# Patient Record
Sex: Male | Born: 1990
Health system: Southern US, Community
[De-identification: ages and names within clinical notes are randomized; demographics above are authoritative.]

## PROBLEM LIST (undated history)

## (undated) DIAGNOSIS — F909 Attention-deficit hyperactivity disorder, unspecified type: Secondary | ICD-10-CM

---

## 2017-01-16 ENCOUNTER — Encounter (HOSPITAL_COMMUNITY): Payer: Self-pay | Admitting: Emergency Medicine

## 2017-01-16 ENCOUNTER — Emergency Department (HOSPITAL_COMMUNITY): Payer: 59

## 2017-01-16 ENCOUNTER — Emergency Department (HOSPITAL_COMMUNITY)
Admission: EM | Admit: 2017-01-16 | Discharge: 2017-01-16 | Disposition: A | Discharge status: Home / Self Care | Payer: 59 | Attending: Emergency Medicine | Admitting: Emergency Medicine

## 2017-01-16 DIAGNOSIS — Z79899 Other long term (current) drug therapy: Secondary | ICD-10-CM | POA: Insufficient documentation

## 2017-01-16 DIAGNOSIS — R000 Tachycardia, unspecified: Secondary | ICD-10-CM

## 2017-01-16 DIAGNOSIS — R079 Chest pain, unspecified: Secondary | ICD-10-CM | POA: Diagnosis present

## 2017-01-16 DIAGNOSIS — E860 Dehydration: Secondary | ICD-10-CM | POA: Diagnosis not present

## 2017-01-16 DIAGNOSIS — R0789 Other chest pain: Secondary | ICD-10-CM | POA: Diagnosis not present

## 2017-01-16 DIAGNOSIS — F17210 Nicotine dependence, cigarettes, uncomplicated: Secondary | ICD-10-CM | POA: Insufficient documentation

## 2017-01-16 DIAGNOSIS — F909 Attention-deficit hyperactivity disorder, unspecified type: Secondary | ICD-10-CM | POA: Insufficient documentation

## 2017-01-16 DIAGNOSIS — R0602 Shortness of breath: Secondary | ICD-10-CM | POA: Diagnosis not present

## 2017-01-16 HISTORY — DX: Attention-deficit hyperactivity disorder, unspecified type: F90.9

## 2017-01-16 LAB — CBC WITH DIFFERENTIAL/PLATELET
BASOS PCT: 0 %
Basophils Absolute: 0 10*3/uL (ref 0.0–0.1)
EOS ABS: 0.1 10*3/uL (ref 0.0–0.7)
EOS PCT: 1 %
HCT: 45.2 % (ref 39.0–52.0)
HEMOGLOBIN: 15.5 g/dL (ref 13.0–17.0)
LYMPHS ABS: 2.7 10*3/uL (ref 0.7–4.0)
Lymphocytes Relative: 24 %
MCH: 30.3 pg (ref 26.0–34.0)
MCHC: 34.3 g/dL (ref 30.0–36.0)
MCV: 88.3 fL (ref 78.0–100.0)
Monocytes Absolute: 0.7 10*3/uL (ref 0.1–1.0)
Monocytes Relative: 6 %
NEUTROS PCT: 69 %
Neutro Abs: 8 10*3/uL — ABNORMAL HIGH (ref 1.7–7.7)
PLATELETS: 310 10*3/uL (ref 150–400)
RBC: 5.12 MIL/uL (ref 4.22–5.81)
RDW: 12.9 % (ref 11.5–15.5)
WBC: 11.6 10*3/uL — AB (ref 4.0–10.5)

## 2017-01-16 LAB — COMPREHENSIVE METABOLIC PANEL
ALK PHOS: 38 U/L (ref 38–126)
ALT: 25 U/L (ref 17–63)
AST: 53 U/L — AB (ref 15–41)
Albumin: 4.6 g/dL (ref 3.5–5.0)
Anion gap: 14 (ref 5–15)
BILIRUBIN TOTAL: 1 mg/dL (ref 0.3–1.2)
BUN: 9 mg/dL (ref 6–20)
CALCIUM: 9.5 mg/dL (ref 8.9–10.3)
CO2: 21 mmol/L — ABNORMAL LOW (ref 22–32)
CREATININE: 1.26 mg/dL — AB (ref 0.61–1.24)
Chloride: 100 mmol/L — ABNORMAL LOW (ref 101–111)
Glucose, Bld: 124 mg/dL — ABNORMAL HIGH (ref 65–99)
Potassium: 3.4 mmol/L — ABNORMAL LOW (ref 3.5–5.1)
Sodium: 135 mmol/L (ref 135–145)
TOTAL PROTEIN: 7 g/dL (ref 6.5–8.1)

## 2017-01-16 LAB — RAPID URINE DRUG SCREEN, HOSP PERFORMED
Amphetamines: NOT DETECTED
Barbiturates: NOT DETECTED
Benzodiazepines: NOT DETECTED
COCAINE: NOT DETECTED
OPIATES: NOT DETECTED
Tetrahydrocannabinol: NOT DETECTED

## 2017-01-16 LAB — I-STAT CHEM 8, ED
BUN: 12 mg/dL (ref 6–20)
CHLORIDE: 101 mmol/L (ref 101–111)
CREATININE: 1 mg/dL (ref 0.61–1.24)
Calcium, Ion: 0.98 mmol/L — ABNORMAL LOW (ref 1.15–1.40)
GLUCOSE: 129 mg/dL — AB (ref 65–99)
HEMATOCRIT: 46 % (ref 39.0–52.0)
HEMOGLOBIN: 15.6 g/dL (ref 13.0–17.0)
POTASSIUM: 3.3 mmol/L — AB (ref 3.5–5.1)
Sodium: 139 mmol/L (ref 135–145)
TCO2: 21 mmol/L (ref 0–100)

## 2017-01-16 LAB — I-STAT VENOUS BLOOD GAS, ED
Acid-Base Excess: 2 mmol/L (ref 0.0–2.0)
BICARBONATE: 26.9 mmol/L (ref 20.0–28.0)
O2 SAT: 58 %
TCO2: 28 mmol/L (ref 0–100)
pCO2, Ven: 40.4 mmHg — ABNORMAL LOW (ref 44.0–60.0)
pH, Ven: 7.431 — ABNORMAL HIGH (ref 7.250–7.430)
pO2, Ven: 29 mmHg — CL (ref 32.0–45.0)

## 2017-01-16 LAB — I-STAT TROPONIN, ED: TROPONIN I, POC: 0 ng/mL (ref 0.00–0.08)

## 2017-01-16 MED ORDER — IOPAMIDOL (ISOVUE-370) INJECTION 76%
INTRAVENOUS | Status: AC
  Administered 2017-01-16: 100 mL
  Filled 2017-01-16: qty 100

## 2017-01-16 MED ORDER — GI COCKTAIL ~~LOC~~
30.0000 mL | Freq: Once | ORAL | Status: AC
  Administered 2017-01-16: 30 mL via ORAL
  Filled 2017-01-16: qty 30

## 2017-01-16 MED ORDER — SODIUM CHLORIDE 0.9 % IV BOLUS (SEPSIS)
1000.0000 mL | Freq: Once | INTRAVENOUS | Status: AC
  Administered 2017-01-16: 1000 mL via INTRAVENOUS

## 2017-01-16 NOTE — ED Triage Notes (Signed)
Brought by ems from home for c/o llq abdominal pain that started today as well as tachycardia and mid chest pain worse with breathing.  Reports being hungover today and took phentermine this morning.  Noted to be dyspneic and tachy on arrival.

## 2017-01-16 NOTE — Discharge Instructions (Signed)
Your work up today was reassuring. You were found to be mildly dehydrated. You were given fluids though an IV for this.  Your chest CT did not show any concerning cause of your symptoms. You heart enzyme was normal and your EKG did not suggest you to have any stress on your heart.   It is possible that your symptoms may be due to anxiety. Dehydration in the presence of phentermine use is also a possibility. We advise that you follow up with your primary care doctor to have your kidney function rechecked in 1-2 weeks. Be sure you are drinking plenty of water to prevent dehydration. Avoid phentermine until you consult with your primary care doctor. You may return to the ED, as needed, for new or concerning symptoms.

## 2017-01-16 NOTE — ED Notes (Signed)
Back from CT

## 2017-01-16 NOTE — ED Provider Notes (Signed)
[facility]-EMERGENCY DEPT Provider Note   CSN: [id number] Arrival date & time: 01/16/17  2058    History   Chief Complaint Chief Complaint  Patient presents with  . Shortness of Breath  . Chest Pain    HPI Philip Copeland is a 26 y.o. male.  26 year old male with a history of ADHD presents to the emergency department for evaluation of chest pain and shortness of breath. He reports that he noticed that he was breathing heavier this morning. This became worse as the day progressed and associated with chest pain. Patient complaining of left-sided chest pain that is pleuritic, worse with breathing. He also notes palpitations and a "numb" sensation in his left lower quadrant as well as paresthesias in his RLE. He did go out last night and reports feeling hungover this morning. Triage note reports use of phentermine early this AM. No associated fevers, though patient does report coughing over the last few weeks. He attributes his symptoms to possible bronchitis; cough productive of yellow phlegm. Patient denies leg swelling. No personal or Fhx of DVT/PE. Patient with no hx of chronic medical problems. No hx of abdominal surgeries or anxiety.   The history is provided by the patient, a relative and a parent. No language interpreter was used.    Past Medical History:  Diagnosis Date  . ADHD     There are no active problems to display for this patient.   History reviewed. No pertinent surgical history.    Home Medications    Prior to Admission medications   Medication Sig Start Date End Date Taking? Authorizing Provider  phentermine 37.5 MG capsule Take 37.5 mg by mouth once.   Yes [provider]    Family History No family history on file.  Social History Social History  Substance Use Topics  . Smoking status: Current Some Day Smoker    Types: Cigarettes, E-cigarettes  . Smokeless tobacco: Never Used  . Alcohol use 3.0 oz/week    5 Shots of liquor per week    Comment: 3-4 times a week     Allergies   Patient has no known allergies.   Review of Systems Review of Systems Ten systems reviewed and are negative for acute change, except as noted in the HPI.    Physical Exam Updated Vital Signs BP 132/78   Pulse 85   Temp 98.9 F (37.2 C) (Oral)   Resp 19   Ht 5\' 4"  (1.626 m)   Wt 52.2 kg (115 lb)   SpO2 96%   BMI 19.74 kg/m   Physical Exam  Constitutional: He is oriented to person, place, and time. He appears well-developed and well-nourished. No distress.  Mildly diaphoretic. Anxious appearing. Nontoxic.  HENT:  Head: Normocephalic and atraumatic.  Eyes: Conjunctivae and EOM are normal. No scleral icterus.  Neck: Normal range of motion.  Cardiovascular: Regular rhythm and intact distal pulses.   Tachycardia  Pulmonary/Chest: Effort normal. No respiratory distress. He has no wheezes. He has no rales.  Lungs CTAB. Dyspnea with mild tachypnea. Chest expansion symmetric.  Abdominal: Soft. He exhibits no distension and no mass. There is no guarding.  Soft, nondistended abdomen. No peritoneal signs.  Musculoskeletal: Normal range of motion.  No BLE pitting edema.  Neurological: He is alert and oriented to person, place, and time.  Skin: Skin is warm and dry. No rash noted. He is not diaphoretic. No erythema. No pallor.  Psychiatric: He has a normal mood and affect. His behavior is normal.  Nursing  note and vitals reviewed.    ED Treatments / Results  Labs (all labs ordered are listed, but only abnormal results are displayed) Labs Reviewed  COMPREHENSIVE METABOLIC PANEL - Abnormal; Notable for the following:       Result Value   Potassium 3.4 (*)    Chloride 100 (*)    CO2 21 (*)    Glucose, Bld 124 (*)    Creatinine, Ser 1.26 (*)    AST 53 (*)    All other components within normal limits  CBC WITH DIFFERENTIAL/PLATELET - Abnormal; Notable for the following:    WBC 11.6 (*)    Neutro Abs 8.0 (*)    All other components  within normal limits  I-STAT CHEM 8, ED - Abnormal; Notable for the following:    Potassium 3.3 (*)    Glucose, Bld 129 (*)    Calcium, Ion 0.98 (*)    All other components within normal limits  RAPID URINE DRUG SCREEN, HOSP PERFORMED  BLOOD GAS, VENOUS  I-STAT TROPOININ, ED    EKG  EKG Interpretation  Date/Time:  Saturday January 16 2017 21:04:25 EDT Ventricular Rate:  125 PR Interval:    QRS Duration: 94 QT Interval:  317 QTC Calculation: 458 R Axis:   101 Text Interpretation:  Sinus tachycardia Probable left atrial enlargement Borderline right axis deviation Confirmed by ZAMMIT  MD, JOSEPH [phone]([id number]) on 01/16/2017 9:57:46 PM       Radiology Ct Angio Chest Pe W Or Wo Contrast  Result Date: 01/16/2017 CLINICAL DATA:  Shortness of breath for 2 days and cough for 2 weeks. EXAM: CT ANGIOGRAPHY CHEST WITH CONTRAST TECHNIQUE: Multidetector CT imaging of the chest was performed using the standard protocol during bolus administration of intravenous contrast. Multiplanar CT image reconstructions and MIPs were obtained to evaluate the vascular anatomy. CONTRAST:  80 cc Isovue 370 intravenously. COMPARISON:  Chest radiograph 01/16/2017 FINDINGS: Cardiovascular: Satisfactory opacification of the pulmonary arteries to the segmental level. No evidence of pulmonary embolism. Normal heart size. No pericardial effusion. Mediastinum/Nodes: No enlarged mediastinal, hilar, or axillary lymph nodes. Thyroid gland, trachea, and esophagus demonstrate no significant findings. Lungs/Pleura: Lungs are clear. No pleural effusion or pneumothorax. Upper Abdomen: No acute abnormality. Musculoskeletal: No chest wall abnormality. No acute or significant osseous findings. Review of the MIP images confirms the above findings. IMPRESSION: No evidence of pulmonary embolus. Normal appearance of the thorax. Electronically Signed   By: Ted Mcalpineobrinka  Dimitrova M.D.   On: 01/16/2017 22:43   Dg Chest Port 1 View  Result Date:  01/16/2017 CLINICAL DATA:  Mid chest pain. EXAM: PORTABLE CHEST 1 VIEW COMPARISON:  None. FINDINGS: Cardiomediastinal silhouette is normal. Mediastinal contours appear intact. There is no evidence of focal airspace consolidation, pleural effusion or pneumothorax. Osseous structures are without acute abnormality. Soft tissues are grossly normal. IMPRESSION: No active disease. Electronically Signed   By: Ted Mcalpineobrinka  Dimitrova M.D.   On: 01/16/2017 21:32    Procedures Procedures (including critical care time)  Medications Ordered in ED Medications  sodium chloride 0.9 % bolus 1,000 mL (0 mLs Intravenous Stopped 01/16/17 2220)  iopamidol (ISOVUE-370) 76 % injection (100 mLs  Contrast Given 01/16/17 2154)     Initial Impression / Assessment and Plan / ED Course  I have reviewed the triage vital signs and the nursing notes.  Pertinent labs & imaging results that were available during my care of the patient were reviewed by me and considered in my medical decision making (see chart for details).  26 year old otherwise healthy male presents to the emergency department for evaluation of palpitations, chest pain, and shortness of breath. Symptoms have been progressive over the course of the day. No prior history of anxiety. Patient noted to be mildly diaphoretic, tachycardic, and tachypneic on arrival. No overt abnormalities on physical exam.  Workup initiated with labs and chest CT to rule out pulmonary embolus. Workup reveals mild dehydration with elevated creatinine. Patient hydrated with IV fluids. EKG with borderline right axis deviation. No signs of acute ischemia. Troponin negative. Low suspicion for myocarditis. CT scan today reveals no evidence of pulmonary embolus. No effusion or vascular congestion. No pneumothorax or infiltrate. Heart size appears normal. No evidence of heart strain.  Tachycardia has improved with IV fluids. On repeat assessment, patient states that he is feeling much  better. Question whether symptoms may be partially aggravated by anxiety versus reflux. It is also possible that palpitations and shortness of breath were perpetuated by phentermine use in the setting of dehydration.  Results have been reviewed with the patient in their entirety. He has been instructed to avoid phentermine use and to follow-up with his primary care doctor. Referral to cardiology given should palpitations persist as he may benefit from the use of Holter monitoring. Return precautions discussed and provided. Patient discharged in stable condition with no unaddressed concerns.   Vitals:   01/16/17 2115 01/16/17 2130 01/16/17 2230 01/16/17 2245  BP: 111/85 115/82 132/78 120/82  Pulse: (!) 112 86 85 74  Resp: (!) 32 (!) 39 19 (!) 22  Temp:      TempSrc:      SpO2: 100% 100% 96% 97%  Weight:      Height:        Final Clinical Impressions(s) / ED Diagnoses   Final diagnoses:  Atypical chest pain  Shortness of breath  Tachycardia  Dehydration    New Prescriptions New Prescriptions   No medications on file     Darylene Price 01/16/17 2330    Bethann Berkshire, MD 01/16/17 2348

## 2017-01-16 NOTE — ED Notes (Signed)
Going to CT at this time.

## 2018-03-02 IMAGING — CT CT ANGIO CHEST
2 of 8 series · 19 of 46 positions shown · IV contrast (OMNI)
Comparison: Chest radiograph 01/16/2017

CLINICAL DATA: Shortness of breath for 2 days and cough for 2
weeks.

EXAM:
CT ANGIOGRAPHY CHEST WITH CONTRAST
TECHNIQUE: Multidetector CT imaging of the chest was performed using the
standard protocol during bolus administration of intravenous
contrast. Multiplanar CT image reconstructions and MIPs were
obtained to evaluate the vascular anatomy.
CONTRAST:  80 cc Isovue 370 intravenously.

[Series 6: thins · axial · 0.59mm/px · z∈[+1030,+1283]mm · 16 of 279 slices shown]
[im 13/279  lung]
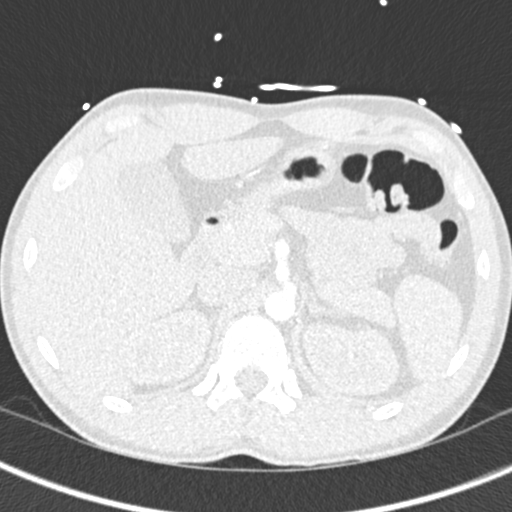
[im 26/279  soft-tissue]
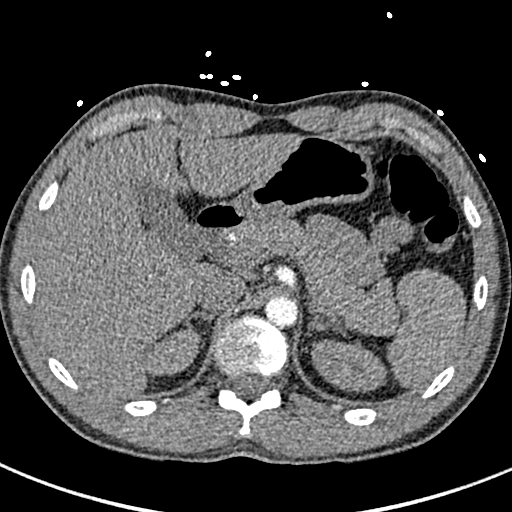
[im 51/279  lung]
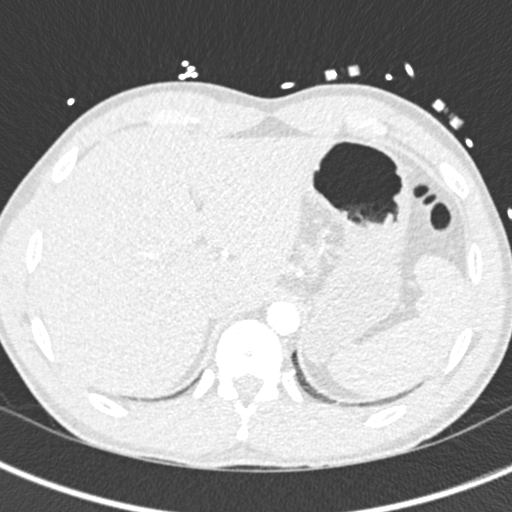
[im 64/279  soft-tissue]
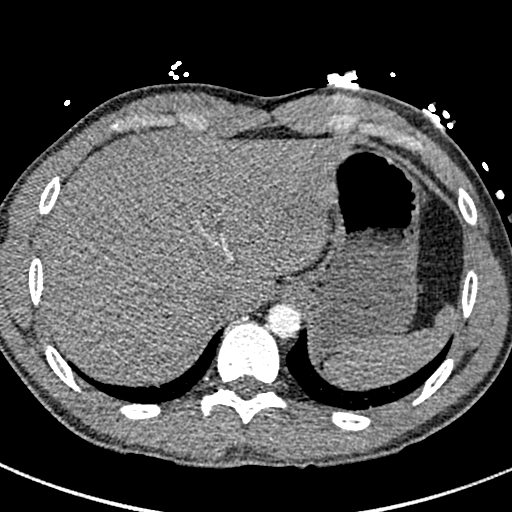
[im 76/279  lung]
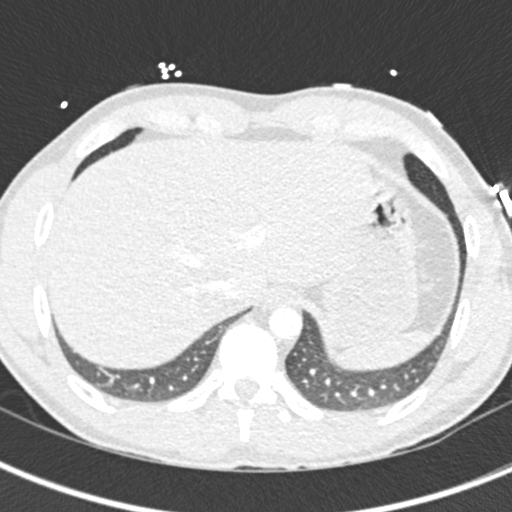
[im 102/279  soft-tissue]
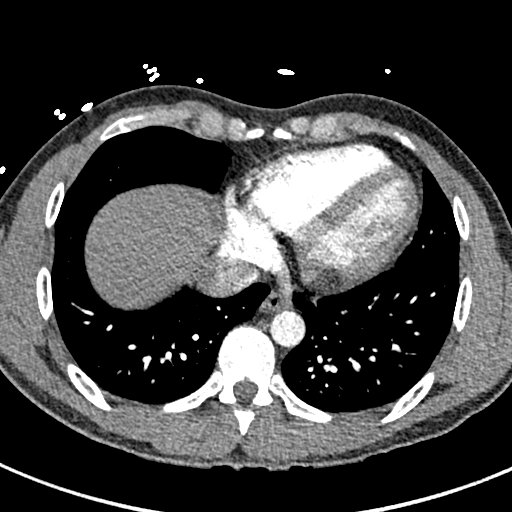
[im 114/279  lung]
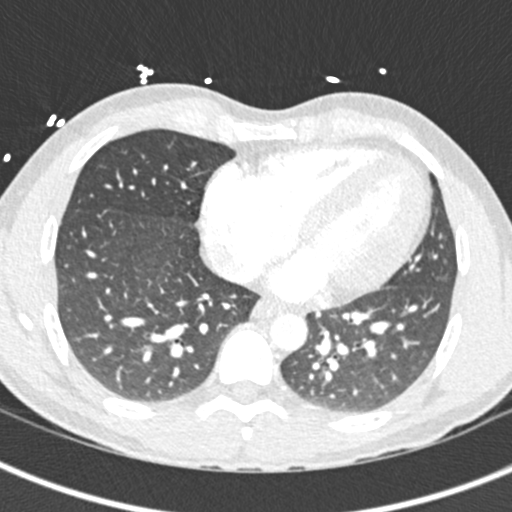
[im 127/279  soft-tissue]
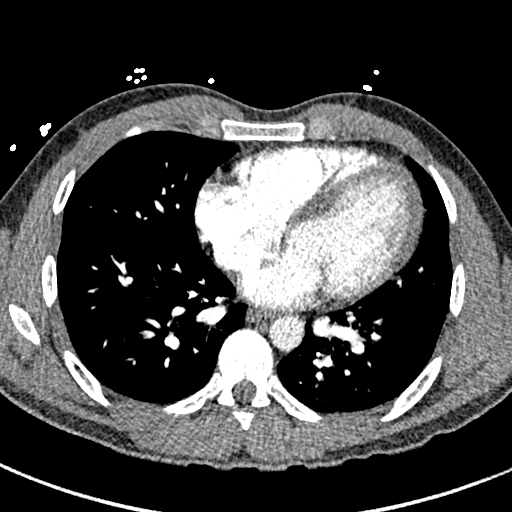
[im 152/279  lung]
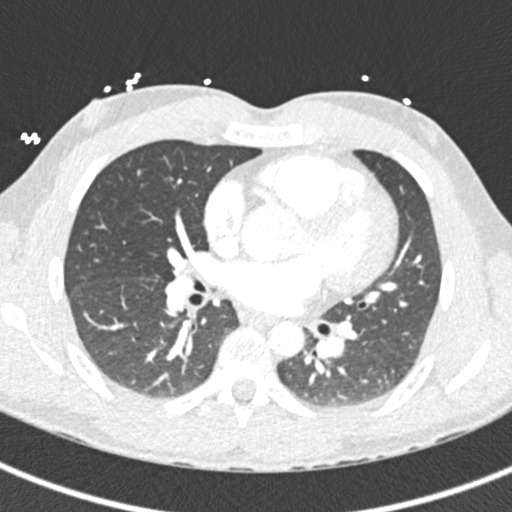
[im 165/279  soft-tissue]
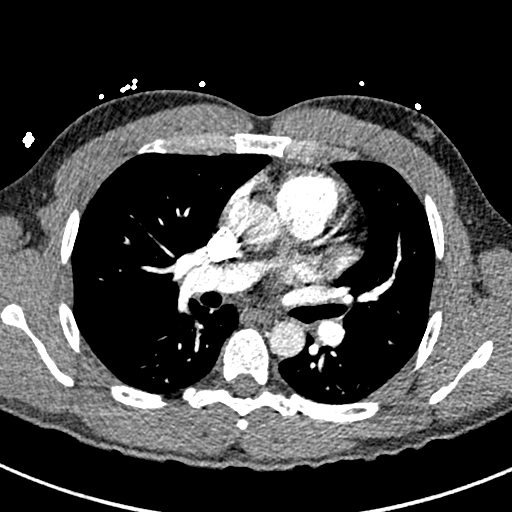
[im 177/279  lung]
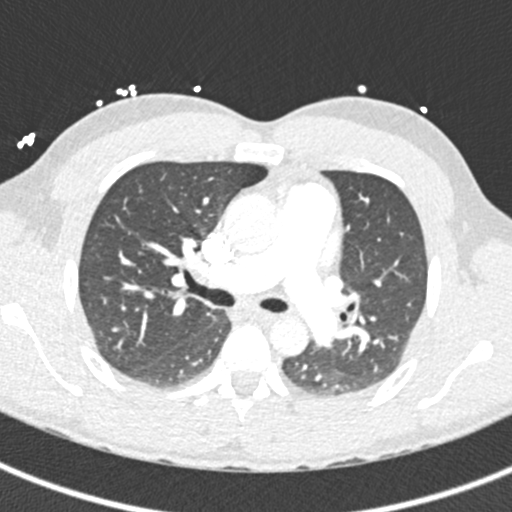
[im 203/279  soft-tissue]
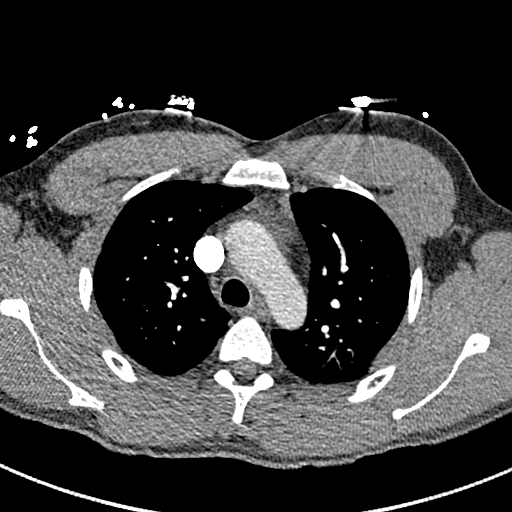
[im 215/279  lung]
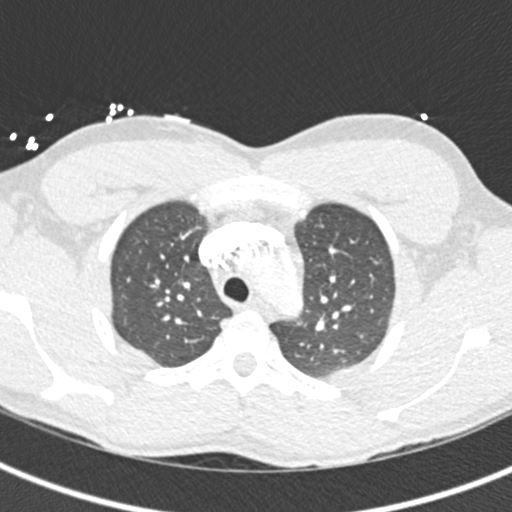
[im 228/279  soft-tissue]
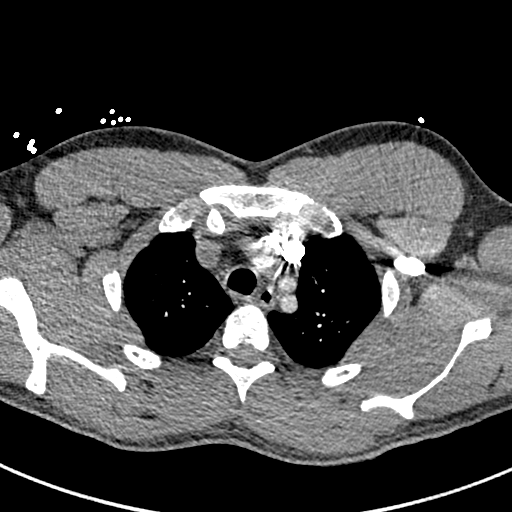
[im 253/279  lung]
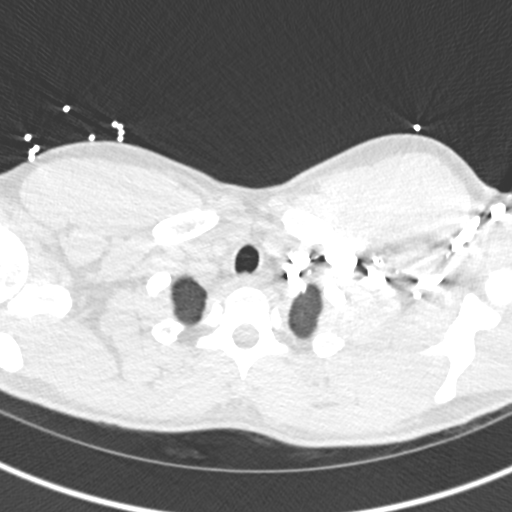
[im 266/279  soft-tissue]
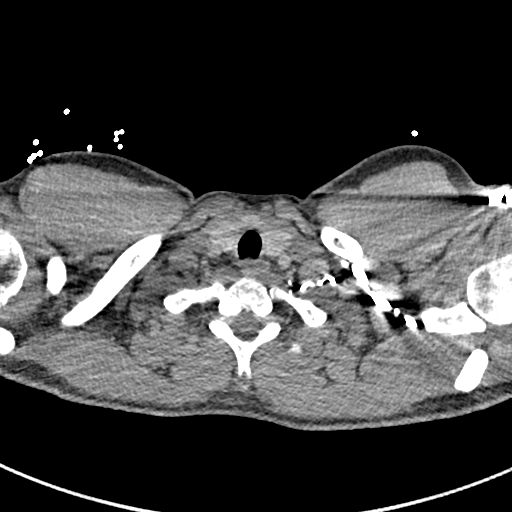

[Series 8: coronal mpr · coronal · 0.55mm/px · 3 of 114 slices shown]
[im 29/114  soft-tissue]
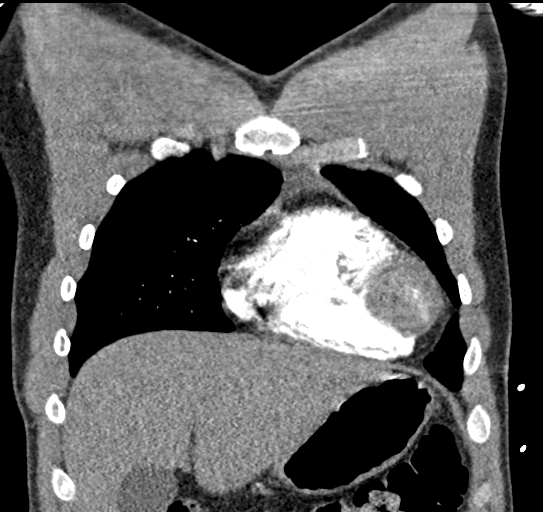
[im 57/114  soft-tissue]
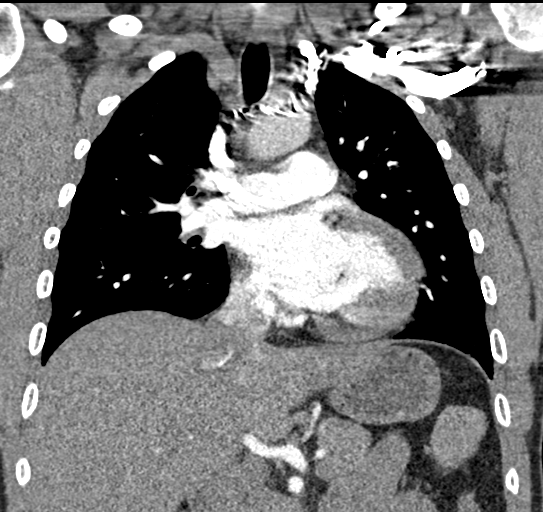
[im 85/114  soft-tissue]
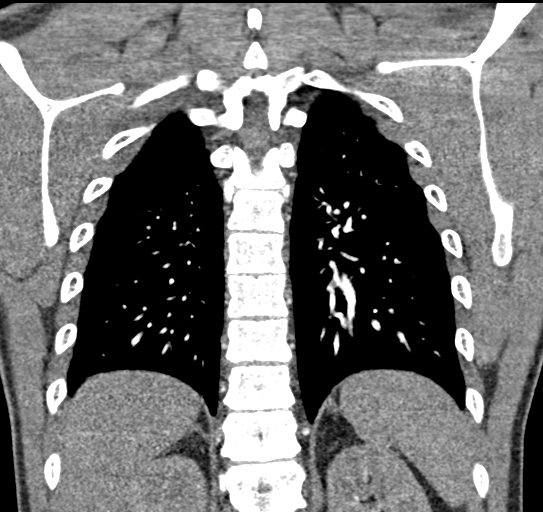

[19 of 46 positions shown; findings below may reference images not displayed]

FINDINGS: Cardiovascular: Satisfactory opacification of the pulmonary arteries
to the segmental level. No evidence of pulmonary embolism. Normal
heart size. No pericardial effusion.

Mediastinum/Nodes: No enlarged mediastinal, hilar, or axillary lymph
nodes. Thyroid gland, trachea, and esophagus demonstrate no
significant findings.

Lungs/Pleura: Lungs are clear. No pleural effusion or pneumothorax.

Upper Abdomen: No acute abnormality.

Musculoskeletal: No chest wall abnormality. No acute or significant
osseous findings.

Review of the MIP images confirms the above findings.
IMPRESSION: No evidence of pulmonary embolus.

Normal appearance of the thorax.

## 2018-09-13 DIAGNOSIS — F988 Other specified behavioral and emotional disorders with onset usually occurring in childhood and adolescence: Secondary | ICD-10-CM | POA: Diagnosis not present

## 2018-10-17 MED FILL — VYVANSE 40 MG CAPSULE: 40 | 30 days supply | Qty: 30 | Fill #0

## 2018-10-24 ENCOUNTER — Encounter: Payer: Self-pay | Admitting: Physician Assistant

## 2018-10-24 ENCOUNTER — Telehealth: Payer: 59 | Admitting: Physician Assistant

## 2018-10-24 DIAGNOSIS — R197 Diarrhea, unspecified: Secondary | ICD-10-CM

## 2018-10-24 DIAGNOSIS — R0602 Shortness of breath: Secondary | ICD-10-CM

## 2018-10-24 DIAGNOSIS — R6889 Other general symptoms and signs: Secondary | ICD-10-CM

## 2018-10-24 MED ORDER — ALBUTEROL SULFATE HFA 108 (90 BASE) MCG/ACT IN AERS: 2.0000 | INHALATION_SPRAY | Freq: Four times a day (QID) | RESPIRATORY_TRACT | 0 refills | Status: AC | PRN

## 2018-10-24 MED ORDER — BENZONATATE 100 MG PO CAPS: 100.0000 mg | ORAL_CAPSULE | Freq: Two times a day (BID) | ORAL | 0 refills | Status: AC | PRN

## 2018-10-24 NOTE — Progress Notes (Signed)
E-Visit for Corona Virus  Based on your current symptoms, you may very well have the virus, however your symptoms are mild. Currently, not all patients are being tested. If the symptoms are mild and there is not a known exposure, performing the test is not indicated.   I have also attached a work note for you    Coronavirus disease 2019 (COVID-19) is a respiratory illness that can spread from person to person. The virus that causes COVID-19 is a new virus that was first identified in the country of [place] but is now found in multiple other countries and has spread to the [place].  Symptoms associated with the virus are mild to severe fever, cough, and shortness of breath. There is currently no vaccine to protect against COVID-19, and there is no specific antiviral treatment for the virus.   To be considered HIGH RISK for Coronavirus (COVID-19), you have to meet the following criteria:  . Traveled to [place], [place], [place], [place] or [place]; or in the [place] to [place], [place], [place], or [place]; and have fever, cough, and shortness of breath within the last 2 weeks of travel OR  . Been in close contact with a person diagnosed with COVID-19 within the last 2 weeks and have fever, cough, and shortness of breath  . IF YOU DO NOT MEET THESE CRITERIA, YOU ARE CONSIDERED LOW RISK FOR COVID-19.   It is vitally important that if you feel that you have an infection such as this virus or any other virus that you stay home and away from places where you may spread it to others.  You should self-quarantine for 14 days if you have symptoms that could potentially be coronavirus and avoid contact with people age 73 and older.   You can use medication such as A prescription cough medication called Tessalon Perles 100 mg. You may take 1-2 capsules every 8 hours as needed for cough and A prescription inhaler called Albuterol MDI 90 mcg /actuation 2 puffs every 4 hours as needed for shortness  of breath, wheezing, cough  You may also take acetaminophen (Tylenol) as needed for fever.   Reduce your risk of any infection by using the same precautions used for avoiding the common cold or flu:  Marland Kitchen Wash your hands often with soap and warm water for at least 20 seconds.  If soap and water are not readily available, use an alcohol-based hand sanitizer with at least 60% alcohol.  . If coughing or sneezing, cover your mouth and nose by coughing or sneezing into the elbow areas of your shirt or coat, into a tissue or into your sleeve (not your hands). . Avoid shaking hands with others and consider head nods or verbal greetings only. . Avoid touching your eyes, nose, or mouth with unwashed hands.  . Avoid close contact with people who are sick. . Avoid places or events with large numbers of people in one location, like concerts or sporting events. . Carefully consider travel plans you have or are making. . If you are planning any travel outside or inside the [place], visit the CDC's Travelers' Health webpage for the latest health notices. . If you have some symptoms but not all symptoms, continue to monitor at home and seek medical attention if your symptoms worsen. . If you are having a medical emergency, call 911.  HOME CARE . Only take medications as instructed by your medical team. . Drink plenty of fluids and get plenty  of rest. . A steam or ultrasonic humidifier can help if you have congestion.   GET HELP RIGHT AWAY IF: . You develop worsening fever. . You become short of breath . You cough up blood. . Your symptoms become more severe MAKE SURE YOU   Understand these instructions.  Will watch your condition.  Will get help right away if you are not doing well or get worse.  Your e-visit answers were reviewed by a board certified advanced clinical practitioner to complete your personal care plan.  Depending on the condition, your plan could have included both over the counter or  prescription medications.  If there is a problem please reply once you have received a response from your provider. Your safety is important to [place].  If you have drug allergies check your prescription carefully.    You can use MyChart to ask questions about today's visit, request a non-urgent call back, or ask for a work or school excuse for 24 hours related to this e-Visit. If it has been greater than 24 hours you will need to follow up with your provider, or enter a new e-Visit to address those concerns. You will get an e-mail in the next two days asking about your experience.  I hope that your e-visit has been valuable and will speed your recovery. Thank you for using e-visits.   I have spent 7 min in completion and review of this note- Illa Level [facility]

## 2018-10-25 MED FILL — ALBUTEROL SULFATE HFA 108 (: 108 (90 BAS | 25 days supply | Qty: 18 | Fill #0

## 2018-10-25 MED FILL — BENZONATATE 100 MG CAP: 100 | 10 days supply | Qty: 20 | Fill #0

## 2018-11-14 MED FILL — VYVANSE 40 MG CAPSULE: 40 | 30 days supply | Qty: 30 | Fill #0

## 2018-11-18 DIAGNOSIS — R05 Cough: Secondary | ICD-10-CM | POA: Diagnosis not present

## 2018-11-18 DIAGNOSIS — Z7189 Other specified counseling: Secondary | ICD-10-CM | POA: Diagnosis not present

## 2018-11-18 DIAGNOSIS — B9789 Other viral agents as the cause of diseases classified elsewhere: Secondary | ICD-10-CM | POA: Diagnosis not present

## 2018-11-18 DIAGNOSIS — J069 Acute upper respiratory infection, unspecified: Secondary | ICD-10-CM | POA: Diagnosis not present

## 2018-11-18 DIAGNOSIS — J029 Acute pharyngitis, unspecified: Secondary | ICD-10-CM | POA: Diagnosis not present

## 2018-11-18 DIAGNOSIS — Z20828 Contact with and (suspected) exposure to other viral communicable diseases: Secondary | ICD-10-CM | POA: Diagnosis not present

## 2018-11-18 DIAGNOSIS — J028 Acute pharyngitis due to other specified organisms: Secondary | ICD-10-CM | POA: Diagnosis not present

## 2018-11-21 MED FILL — HYDROCODONE-HOMATROPINE SOL: 5-1.5 | 5 days supply | Qty: 120 | Fill #0

## 2018-12-27 MED FILL — VYVANSE 40 MG CAPSULE: 40 | 30 days supply | Qty: 30 | Fill #0

## 2019-01-30 DIAGNOSIS — F17210 Nicotine dependence, cigarettes, uncomplicated: Secondary | ICD-10-CM | POA: Diagnosis not present

## 2019-01-30 DIAGNOSIS — F909 Attention-deficit hyperactivity disorder, unspecified type: Secondary | ICD-10-CM | POA: Diagnosis not present

## 2019-01-30 DIAGNOSIS — Z0000 Encounter for general adult medical examination without abnormal findings: Secondary | ICD-10-CM | POA: Diagnosis not present

## 2019-01-30 MED FILL — VYVANSE 40 MG CAPSULE: 40 | 30 days supply | Qty: 30 | Fill #0

## 2019-01-30 MED FILL — buPROPion HCL ER (XL) 150 M: 150 | 30 days supply | Qty: 30 | Fill #0

## 2019-02-15 DIAGNOSIS — Z111 Encounter for screening for respiratory tuberculosis: Secondary | ICD-10-CM | POA: Diagnosis not present

## 2019-03-02 MED FILL — VYVANSE 40 MG CAPSULE: 40 | 30 days supply | Qty: 30 | Fill #0

## 2019-03-31 MED FILL — buPROPion HCL ER (XL) 150 M: 150 | 30 days supply | Qty: 30 | Fill #0

## 2019-03-31 MED FILL — VYVANSE 40 MG CAPSULE: 40 | 30 days supply | Qty: 30 | Fill #0

## 2019-05-08 ENCOUNTER — Other Ambulatory Visit: Payer: Self-pay

## 2019-05-08 DIAGNOSIS — Z20822 Contact with and (suspected) exposure to covid-19: Secondary | ICD-10-CM

## 2019-05-08 NOTE — Addendum Note (Signed)
Addended by: Jonelle Sidle E on: 05/08/2019 03:15 PM   Modules accepted: Orders
